# Patient Record
Sex: Female | Born: 1985 | Race: White | Hispanic: No | Marital: Married
Health system: Southern US, Community
[De-identification: ages and names within clinical notes are randomized; demographics above are authoritative.]

---

## 2011-02-28 ENCOUNTER — Other Ambulatory Visit (HOSPITAL_COMMUNITY)
Admission: RE | Admit: 2011-02-28 | Discharge: 2011-02-28 | Disposition: A | Discharge status: Home / Self Care | Payer: BC Managed Care – PPO | Source: Ambulatory Visit | Attending: Obstetrics and Gynecology | Admitting: Obstetrics and Gynecology

## 2011-02-28 DIAGNOSIS — Z124 Encounter for screening for malignant neoplasm of cervix: Secondary | ICD-10-CM | POA: Insufficient documentation

## 2013-06-16 ENCOUNTER — Ambulatory Visit: Payer: BC Managed Care – PPO | Admitting: Internal Medicine

## 2013-06-27 ENCOUNTER — Other Ambulatory Visit: Payer: Self-pay | Admitting: Neurosurgery

## 2013-06-27 DIAGNOSIS — M542 Cervicalgia: Secondary | ICD-10-CM

## 2013-06-29 ENCOUNTER — Ambulatory Visit
Admission: RE | Admit: 2013-06-29 | Discharge: 2013-06-29 | Disposition: A | Discharge status: Home / Self Care | Payer: BC Managed Care – PPO | Source: Ambulatory Visit | Attending: Neurosurgery | Admitting: Neurosurgery

## 2013-06-29 DIAGNOSIS — M542 Cervicalgia: Secondary | ICD-10-CM
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
## 2021-04-17 NOTE — Telephone Encounter (Signed)
Went to urgent care yesterday , they said she has an abnormal ekg , she says she doesn't have a pcp , she says her ins, doesn't require on, very concerned has a 35 year old and her ins. Expires at the end of the year, can you see her ?

## 2021-04-17 NOTE — Telephone Encounter (Signed)
Can see next week

## 2021-04-22 ENCOUNTER — Emergency Department
Admit: 2021-04-22 | Payer: PRIVATE HEALTH INSURANCE | Primary: Student in an Organized Health Care Education/Training Program

## 2021-04-22 ENCOUNTER — Inpatient Hospital Stay
Admit: 2021-04-22 | Discharge: 2021-04-22 | Disposition: A | Discharge status: Home / Self Care | Payer: PRIVATE HEALTH INSURANCE | Attending: Emergency Medicine

## 2021-04-22 DIAGNOSIS — R079 Chest pain, unspecified: Secondary | ICD-10-CM

## 2021-04-22 LAB — METABOLIC PANEL, BASIC
Anion gap: 6 mmol/L (ref 4–12)
BUN: 14 mg/dL (ref 7–18)
CO2: 28 mmol/L (ref 21.0–32.0)
Calcium: 8.5 mg/dL (ref 8.5–10.1)
Chloride: 105 mmol/L (ref 98–107)
Creatinine: 0.74 mg/dL (ref 0.55–1.02)
GFR est AA: >60 mL/min/{1.73_m2} (ref >60)
GFR est non-AA: >60 mL/min/{1.73_m2} (ref >60)
Glucose: 101 mg/dL (ref 74–106)
Potassium: 3.7 mmol/L (ref 3.5–5.1)
Sodium: 139 mmol/L (ref 136–145)

## 2021-04-22 LAB — TROPONIN-HIGH SENSITIVITY: Troponin-High Sensitivity: 3 ng/L (ref 0.0–50.0)

## 2021-04-22 LAB — CBC WITH AUTOMATED DIFF
ABS. BASOPHILS: 0.1 10*3/uL (ref 0.0–0.4)
ABS. EOSINOPHILS: 0 10*3/uL (ref 0.0–1.0)
ABS. IMM. GRANS.: 0 10*3/uL
ABS. LYMPHOCYTES: 2.3 10*3/uL (ref 0.9–4.2)
ABS. MONOCYTES: 0.4 10*3/uL (ref 0.1–1.7)
ABS. NEUTROPHILS: 4.2 10*3/uL (ref 2.3–7.6)
ABSOLUTE NRBC: 0 10*3/uL (ref 0.0–0.01)
ATYPICAL LYMPHS: 2 % — ABNORMAL HIGH
BASOPHILS: 1 % (ref 0.0–3.0)
EOSINOPHILS: 0 % (ref 0.0–7.0)
HCT: 38.4 % (ref 36.0–47.0)
HGB: 12.7 g/dL (ref 12.0–16.0)
IMMATURE GRANULOCYTES: 0 %
LYMPHOCYTES: 31 % (ref 18.0–40.0)
MCH: 29.2 PG (ref 27.0–35.0)
MCHC: 33.1 g/dL (ref 30.7–37.3)
MCV: 88.3 FL (ref 81.0–94.0)
MONOCYTES: 6 % (ref 2.0–12.0)
MPV: 10.1 FL (ref 9.2–11.8)
NEUTROPHILS: 60 % (ref 48.0–72.0)
NRBC: 0 PER 100 WBC
PLATELET ESTIMATE: ADEQUATE
PLATELET: 236 10*3/uL (ref 130–400)
RBC: 4.35 M/uL (ref 4.20–5.40)
RDW: 12.2 % (ref 11.5–14.0)
WBC: 7 10*3/uL (ref 4.8–10.6)

## 2021-04-22 LAB — EKG, 12 LEAD, INITIAL
Atrial Rate: 69 {beats}/min
Calculated P Axis: 57 degrees
Calculated R Axis: 63 degrees
Calculated T Axis: 63 degrees
Diagnosis: NORMAL
P-R Interval: 152 ms
Q-T Interval: 418 ms
QRS Duration: 90 ms
QTC Calculation (Bezet): 447 ms
Ventricular Rate: 69 {beats}/min

## 2021-04-22 LAB — HCG URINE, QL
HCG urine, QL: NEGATIVE
Pregnancy Test(Urn): NEGATIVE

## 2021-04-22 LAB — EKG 12-LEAD
Atrial Rate: 69 {beats}/min
Diagnosis: NORMAL
P Axis: 57 degrees
P-R Interval: 152 ms
Q-T Interval: 418 ms
QRS Duration: 90 ms
QTc Calculation (Bazett): 447 ms
R Axis: 63 degrees
T Axis: 63 degrees
Ventricular Rate: 69 {beats}/min

## 2021-04-22 LAB — CBC WITH AUTO DIFFERENTIAL
Atypical Lymphocytes: 2 % — ABNORMAL HIGH
Basophils %: 1 % (ref 0.0–3.0)
Basophils Absolute: 0.1 10*3/uL (ref 0.0–0.4)
Eosinophils %: 0 % (ref 0.0–7.0)
Eosinophils Absolute: 0 10*3/uL (ref 0.0–1.0)
Granulocyte Absolute Count: 0 10*3/uL
Hematocrit: 38.4 % (ref 36.0–47.0)
Hemoglobin: 12.7 g/dL (ref 12.0–16.0)
Immature Granulocytes: 0 %
Lymphocytes %: 31 % (ref 18.0–40.0)
Lymphocytes Absolute: 2.3 10*3/uL (ref 0.9–4.2)
MCH: 29.2 PG (ref 27.0–35.0)
MCHC: 33.1 g/dL (ref 30.7–37.3)
MCV: 88.3 FL (ref 81.0–94.0)
MPV: 10.1 FL (ref 9.2–11.8)
Monocytes %: 6 % (ref 2.0–12.0)
Monocytes Absolute: 0.4 10*3/uL (ref 0.1–1.7)
NRBC Absolute: 0 10*3/uL (ref 0.0–0.01)
Neutrophils %: 60 % (ref 48.0–72.0)
Neutrophils Absolute: 4.2 10*3/uL (ref 2.3–7.6)
Nucleated RBCs: 0 PER 100 WBC
Platelet Estimate: ADEQUATE
Platelets: 236 10*3/uL (ref 130–400)
RBC: 4.35 M/uL (ref 4.20–5.40)
RDW: 12.2 % (ref 11.5–14.0)
WBC: 7 10*3/uL (ref 4.8–10.6)

## 2021-04-22 LAB — BASIC METABOLIC PANEL
Anion Gap: 6 mmol/L (ref 4–12)
BUN: 14 mg/dL (ref 7–18)
CO2: 28 mmol/L (ref 21.0–32.0)
Calcium: 8.5 mg/dL (ref 8.5–10.1)
Chloride: 105 mmol/L (ref 98–107)
Creatinine: 0.74 mg/dL (ref 0.55–1.02)
EGFR IF NonAfrican American: >60 mL/min/{1.73_m2} (ref >60)
GFR African American: >60 mL/min/{1.73_m2} (ref >60)
Glucose: 101 mg/dL (ref 74–106)
Potassium: 3.7 mmol/L (ref 3.5–5.1)
Sodium: 139 mmol/L (ref 136–145)

## 2021-04-22 LAB — TROPONIN, HIGH SENSITIVITY: Troponin, High Sensitivity: 3 ng/L (ref 0.0–50.0)

## 2021-04-22 MED ORDER — IBUPROFEN 600 MG TAB
600 mg | ORAL_TABLET | Freq: Four times a day (QID) | ORAL | 0 refills | Status: AC | PRN
Start: 2021-04-22 — End: ?

## 2021-04-22 NOTE — ED Notes (Signed)
 Physical assessment completed. The patient's level of consciousness is alert. The patient's mood is cooperative and anxious. The patient's appearance shows normal skin assessment. The patient does not  indicate signs or symptoms of abuse or neglect.

## 2021-04-22 NOTE — ED Notes (Signed)
Patient discharged home by Dr. Faylene Million without incidence. Discharge instructions given to patient written and verbally. Patient verbalizes understanding of discharge instructions.

## 2021-04-22 NOTE — ED Notes (Signed)
Patient states chest pain started over a week ago. States she went to [facility] and they told her she has GERD. States she has a cardiologist appointment in the coming week but she got anxious tonight and started having numbness in her left arm as well.

## 2021-04-22 NOTE — ED Provider Notes (Signed)
35 year old female no contributory past medical history presents with 3 days constant sharp pain in lower left sternum and underneath left breast without aggravating or alleviating factors, prior episodes, or associated symptoms.  Patient made appointment to follow-up with cardiologist about this pain but was concerned that she is to see someone sooner so presented to the ED.  Pain has been constant and unchanged since onset.  Patient denies exertional chest pain, pleuritic chest pain, worsening symptoms, difficulty breathing, abdominal pain, hypertension, hyperlipidemia, diabetes, smoking, drug use, cardiac history, syncope/exertional syncope, early cardiac history in family, unexplained sudden death history in family, marfanoid family, cough/hemoptysis, fever, recent travel/trauma/surgery/immobilization, DVT/PE/hypercoagulability history in self or family, lower extremity edema/calf pain, exogenous estrogen therapy, cancer history.       History reviewed. No pertinent past medical history.    History reviewed. No pertinent surgical history.      History reviewed. No pertinent family history.    Social History     Socioeconomic History    Marital status: SINGLE     Spouse name: Not on file    Number of children: Not on file    Years of education: Not on file    Highest education level: Not on file   Occupational History    Not on file   Tobacco Use    Smoking status: Never    Smokeless tobacco: Never   Substance and Sexual Activity    Alcohol use: Yes     Comment: Socially    Drug use: Never    Sexual activity: Not on file   Other Topics Concern    Not on file   Social History Narrative    Not on file     Social Determinants of Health     Financial Resource Strain: Not on file   Food Insecurity: Not on file   Transportation Needs: Not on file   Physical Activity: Not on file   Stress: Not on file   Social Connections: Not on file   Intimate Partner Violence: Not At Risk    Fear of Current or Ex-Partner: No     Emotionally Abused: No    Physically Abused: No    Sexually Abused: No   Housing Stability: Not on file         ALLERGIES: Patient has no known allergies.    Review of Systems   Constitutional:  Negative for chills and fever.   HENT:  Negative for congestion and rhinorrhea.    Eyes:  Negative for discharge and redness.   Respiratory:  Negative for cough and shortness of breath.    Cardiovascular:  Positive for chest pain. Negative for leg swelling.   Gastrointestinal:  Negative for abdominal pain and vomiting.   Endocrine: Negative for cold intolerance and heat intolerance.   Genitourinary:  Negative for dysuria and frequency.   Musculoskeletal:  Negative for back pain and neck pain.   Neurological:  Negative for seizures and headaches.   Psychiatric/Behavioral:  Negative for self-injury and suicidal ideas.      Vitals:    04/22/21 0028 04/22/21 0201 04/22/21 0350   BP: 108/69 98/67 114/83   Pulse: 72 71 74   Resp: 17 16 16    Temp: 97.3 ??F (36.3 ??C)     SpO2: 100% 99% 99%   Weight: 67.1 kg (148 lb)     Height: 5\' 7"  (1.702 m)              Physical Exam  Vitals and nursing note  reviewed.   Constitutional:       General: She is not in acute distress.     Appearance: Normal appearance. She is not ill-appearing, toxic-appearing or diaphoretic.   HENT:      Head: Normocephalic and atraumatic.      Nose: Nose normal. No congestion or rhinorrhea.      Mouth/Throat:      Mouth: Mucous membranes are moist.      Pharynx: Oropharynx is clear. No posterior oropharyngeal erythema.   Eyes:      General:         Right eye: No discharge.         Left eye: No discharge.      Conjunctiva/sclera: Conjunctivae normal.   Cardiovascular:      Rate and Rhythm: Normal rate and regular rhythm.      Heart sounds: Normal heart sounds. Heart sounds not distant. No murmur heard.    No friction rub. No gallop.   Pulmonary:      Effort: Pulmonary effort is normal. No respiratory distress.      Breath sounds: Normal breath sounds. No stridor. No  decreased breath sounds, wheezing, rhonchi or rales.   Chest:      Chest wall: No mass, deformity, tenderness, crepitus or edema.   Abdominal:      General: There is no distension.      Palpations: Abdomen is soft. There is no mass.      Tenderness: There is no abdominal tenderness. There is no guarding or rebound.   Musculoskeletal:         General: No signs of injury. Normal range of motion.      Cervical back: Normal range of motion and neck supple.      Right lower leg: No edema.      Left lower leg: No edema.   Skin:     General: Skin is warm and dry.      Findings: No rash.   Neurological:      Mental Status: She is alert and oriented to person, place, and time. Mental status is at baseline.      Motor: No weakness.      Coordination: Coordination normal.   Psychiatric:         Mood and Affect: Mood normal.         Behavior: Behavior normal.         Thought Content: Thought content normal.         Judgment: Judgment normal.        MDM  Number of Diagnoses or Management Options  Chest pain, unspecified type  Diagnosis management comments: 3 days constant chest pain, patient well-appearing, normal vital signs, no concerning physical exam findings, PE ruled out with PERC, no risk factors for aortic dissection, obtained chest x-ray EKG and high-sensitivity troponin to rule out pneumothorax, mass, atypical ACS, pericarditis/myocarditis, which showed no emergent findings.  Heart score 1, single troponin appropriate in this patient given 3 days of constant symptoms prior to arrival. patient appropriate for close outpatient follow-up with PCP and cardiologist at this time.  Discussed follow-up instructions and return precautions with patient who showed understanding of them, denied having any other questions or concerns.       Amount and/or Complexity of Data Reviewed  Clinical lab tests: ordered and reviewed  Tests in the radiology section of CPT??: ordered and reviewed           EKG    Date/Time: 04/22/2021 12:31  AM  Performed by: Jeb Levering, MD  Authorized by: Jeb Levering, MD     ECG reviewed by ED Physician in the absence of a cardiologist: no    Previous ECG:     Previous ECG:  Unavailable  Interpretation:     Interpretation: normal    Rate:     ECG rate assessment: normal    Rhythm:     Rhythm: sinus rhythm    Ectopy:     Ectopy: none    QRS:     QRS axis:  Normal    QRS intervals:  Normal    QRS conduction: normal    ST segments:     ST segments:  Normal  T waves:     T waves: normal    Q waves:     Abnormal Q-waves: not present

## 2021-04-23 ENCOUNTER — Ambulatory Visit
Admit: 2021-04-23 | Discharge: 2021-04-23 | Payer: PRIVATE HEALTH INSURANCE | Attending: Cardiovascular Disease | Primary: Student in an Organized Health Care Education/Training Program

## 2021-04-23 DIAGNOSIS — R079 Chest pain, unspecified: Secondary | ICD-10-CM

## 2021-04-23 MED ORDER — NAPROXEN 500 MG TAB
500 mg | ORAL_TABLET | Freq: Two times a day (BID) | ORAL | 0 refills | Status: AC
Start: 2021-04-23 — End: ?

## 2021-04-23 NOTE — Progress Notes (Signed)
Paula Perry is a 35 y.o. female  who comes in for cardiac consultation because patient has experienced 10 days of steady central chest discomfort sometimes radiating above the left breast no other associated symptoms such as shortness of breath sweating fevers feeling productive cough difficulty swallowing change in voice.  She has had not experienced symptoms like this before she was seen in urgency center where she had EKG which showed a sinus rhythm with early repull and first-degree AV block.   She again went to our hospital because symptoms were persistent had lab work negative for infarct normal SMA-7 CBC not pregnant chest x-ray PA lateral unremarkable and EKG sinus rhythm normal  She has never had a cardiac issue as far as palpitations rapid heart rate heart murmur shortness of breath enlarged heart grew up without difficulties she has had no respiratory illness of late she had a flu shot though this was after the symptoms started at the urgency center they gave her some Pepcid to try it did seem to help she has no nocturnal reflux symptoms.  Is a dull feeling is not made worse by changing position breathing swallowing coughing.  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      History reviewed. No pertinent past medical history.      Past Surgical History:   Procedure Laterality Date    HX LEEP PROCEDURE      HX WISDOM TEETH EXTRACTION         No Known Allergies    Current Outpatient Medications   Medication Sig Dispense Refill    naproxen (NAPROSYN) 500 mg tablet Take 1 Tablet by mouth two (2) times daily (with meals). 20 Tablet 0    ibuprofen (MOTRIN) 600 mg tablet Take 1 Tablet by mouth every six  (6) hours as needed for Pain. (Patient not taking: Reported on 04/23/2021) 20 Tablet 0       Family History   Problem Relation Age of Onset    Arthritis-rheumatoid Mother     Diabetes Father     High Cholesterol Father     Hypertension Father     No Known Problems Sister     Sudden Death Brother         OD    Diabetes Maternal Grandfather     Heart Attack Paternal Grandmother     Heart Attack Paternal Grandfather     No Known Problems Son        Social History     Socioeconomic History    Marital status: SINGLE     Spouse name: Not on file    Number of children: Not on file    Years of education: Not on file    Highest education level: Not on file   Occupational History    Not on file   Tobacco Use    Smoking status: Never    Smokeless tobacco: Never   Substance and Sexual Activity    Alcohol use: Yes     Comment: Socially    Drug use: Never    Sexual activity: Not on file   Other  Topics Concern    Not on file   Social History Narrative    Not on file     Social Determinants of Health     Financial Resource Strain: Not on file   Food Insecurity: Not on file   Transportation Needs: Not on file   Physical Activity: Not on file   Stress: Not on file   Social Connections: Not on file   Intimate Partner Violence: Not At Risk    Fear of Current or Ex-Partner: No    Emotionally Abused: No    Physically Abused: No    Sexually Abused: No   Housing Stability: Not on file         Review of systems positive for: As above remainder of the complete review of systems is negative.    Physical exam:  The patient is oriented x 3, appears stated age, well-nourished, well-hydrated.  Visit Vitals  Pulse 72   Resp 16   Wt 150 lb 3.2 oz (68.1 kg)   SpO2 100%   BMI 23.52 kg/m??     HEENT: Normocephalic, conjunctiva pink, sclera white, oral cavity benign  Neck: Supple, no JVD, carotids full, no bruits trachea midline, no adenopathy  Lungs: Normal auscultation and percussion  Cardiac: Regular no murmurs or gallops no rubs  Mild chest wall  tenderness over the lower sternum to palpation slight above the left breast  GI: Bowel sounds present, no tenderness, no masses, no abnormal pulsations  Extremities: Normal pulses, no clubbing, no leg edema, no cords, no inflammation.  Neuro: No focal abnormalities  Genitalia urinary: Deferred     Results for orders placed or performed during the hospital encounter of 04/22/21   HCG URINE, QL   Result Value Ref Range    HCG urine, QL Negative NEG     CBC WITH AUTOMATED DIFF   Result Value Ref Range    WBC 7.0 4.8 - 10.6 K/uL    RBC 4.35 4.20 - 5.40 M/uL    HGB 12.7 12.0 - 16.0 g/dL    HCT 89.3 73.4 - 28.7 %    MCV 88.3 81.0 - 94.0 FL    MCH 29.2 27.0 - 35.0 PG    MCHC 33.1 30.7 - 37.3 g/dL    RDW 68.1 15.7 - 26.2 %    PLATELET 236 130 - 400 K/uL    MPV 10.1 9.2 - 11.8 FL    NRBC 0.0 0 PER 100 WBC    ABSOLUTE NRBC 0.00 0.0 - 0.01 K/uL    NEUTROPHILS 60 48.0 - 72.0 %    LYMPHOCYTES 31 18.0 - 40.0 %    ATYPICAL LYMPHS 2 (H) 0 %    MONOCYTES 6 2.0 - 12.0 %    EOSINOPHILS 0 0.0 - 7.0 %    BASOPHILS 1 0.0 - 3.0 %    IMMATURE GRANULOCYTES 0 %    ABS. NEUTROPHILS 4.2 2.3 - 7.6 K/UL    ABS. LYMPHOCYTES 2.3 0.9 - 4.2 K/UL    ABS. MONOCYTES 0.4 0.1 - 1.7 K/UL    ABS. EOSINOPHILS 0.0 0.0 - 1.0 K/UL    ABS. BASOPHILS 0.1 0.0 - 0.4 K/UL    ABS. IMM. GRANS. 0.0 K/UL    RBC COMMENTS NORMOCYTIC, NORMOCHROMIC      PLATELET ESTIMATE ADEQUATE      DF MANUAL     METABOLIC PANEL, BASIC   Result Value Ref Range    Sodium 139 136 - 145 mmol/L    Potassium 3.7  3.5 - 5.1 mmol/L    Chloride 105 98 - 107 mmol/L    CO2 28 21.0 - 32.0 mmol/L    Anion gap 6 4 - 12 mmol/L    Glucose 101 74 - 106 mg/dL    BUN 14 7 - 18 mg/dL    Creatinine 6.28 3.15 - 1.02 mg/dL    GFR est AA >17 >61 YW/VPX/1.06Y6    GFR est non-AA >60 >60 ml/min/1.40m2    Calcium 8.5 8.5 - 10.1 mg/dL   TROPONIN-HIGH SENSITIVITY   Result Value Ref Range    Troponin-High Sensitivity 3 0.0 - 50.0 ng/L   EKG, 12 LEAD, INITIAL   Result Value Ref Range    Ventricular Rate 69 BPM     Atrial Rate 69 BPM    P-R Interval 152 ms    QRS Duration 90 ms    Q-T Interval 418 ms    QTC Calculation (Bezet) 447 ms    Calculated P Axis 57 degrees    Calculated R Axis 63 degrees    Calculated T Axis 63 degrees    Diagnosis       Normal sinus rhythm  Normal ECG  No previous ECGs available  Confirmed by Janeal Holmes, MD, Zollie Beckers [phone]) on 04/22/2021 7:28:56 AM       No results found for any visits on 04/23/21.     ASSESSMENT:     Encounter Diagnoses   Name Primary?    Chest pain, unspecified type Yes           PLAN:     Orders Placed This Encounter    naproxen (NAPROSYN) 500 mg tablet     Sig: Take 1 Tablet by mouth two (2) times daily (with meals).     Dispense:  20 Tablet     Refill:  0           We will give her Naprosyn 500 mg with breakfast and supper for 5 days warm moist heat to the area 15 minutes 3 times a day  Follow-up in 1 week  She knows to go to the hospital if her symptoms change in intensity or new symptoms develop with palpitation shortness of breath fever chills lightheadedness dizziness etc.    Electronically signed by  Everlean Patterson, MD              Note: This report was transcribed using voice recognition software and is thus   prone to syntax and contextual spelling errors. Please do not hesitate to call   me if anything needs clarification or a dictated addendum

## 2021-05-01 ENCOUNTER — Ambulatory Visit
Admit: 2021-05-01 | Discharge: 2021-05-01 | Payer: PRIVATE HEALTH INSURANCE | Attending: Cardiovascular Disease | Primary: Student in an Organized Health Care Education/Training Program

## 2021-05-01 DIAGNOSIS — R0782 Intercostal pain: Secondary | ICD-10-CM

## 2021-05-01 NOTE — Progress Notes (Signed)
Subjective:   HPI:  Paula Perry is a 35 y.o. female who presents for   Chief Complaint   Patient presents with    Chest Pain     Gone since taking naproxyn       Feels well with Naprosyn with warm moist heat result the chest discomfort she was experiencing she feels completely well now.    History reviewed. No pertinent past medical history.  Current Outpatient Medications   Medication Sig Dispense Refill    naproxen (NAPROSYN) 500 mg tablet Take 1 Tablet by mouth two (2) times daily (with meals). (Patient not taking: Reported on 05/01/2021) 20 Tablet 0    ibuprofen (MOTRIN) 600 mg tablet Take 1 Tablet by mouth every six (6) hours as needed for Pain. (Patient not taking: No sig reported) 20 Tablet 0     No Known Allergies  Past Surgical History:   Procedure Laterality Date    HX LEEP PROCEDURE      HX WISDOM TEETH EXTRACTION       Social History     Socioeconomic History    Marital status: SINGLE   Tobacco Use    Smoking status: Never    Smokeless tobacco: Never   Substance and Sexual Activity    Alcohol use: Yes     Comment: Socially    Drug use: Never     Family History   Problem Relation Age of Onset    Arthritis-rheumatoid Mother     Diabetes Father     High Cholesterol Father     Hypertension Father     No Known Problems Sister     Sudden Death Brother         OD    Diabetes Maternal Grandfather     Heart Attack Paternal Grandmother     Heart Attack Paternal Grandfather     No Known Problems Son         REVIEW OF SYSTEMS:  Review of systems positive for: As above remainder of the complete review of systems is negative.    Objective:   PHYSICAL EXAM:  The patient is oriented x 3, appears stated age, well-nourished, well-hydrated  Visit Vitals  BP 102/64   Pulse 95   Resp 16   SpO2 100%     HEENT: normocephalic conjunctiva are pink sclera white, oral cavity benign  Nek: Supple, no JVD, carotids full, no bruits trachea midline, no adenopathy   Lungs: Normal auscultation and percussicon  Cardiac: Regular no  murmurs or gallops  GI: Bowel sounds present, no tenderness, no masses, no abnormal pulsations  Extremities: Normal pulses, no clubbing, no leg edema, no cords, no inflammation    Results for orders placed or performed during the hospital encounter of 04/22/21   HCG URINE, QL   Result Value Ref Range    HCG urine, QL Negative NEG     CBC WITH AUTOMATED DIFF   Result Value Ref Range    WBC 7.0 4.8 - 10.6 K/uL    RBC 4.35 4.20 - 5.40 M/uL    HGB 12.7 12.0 - 16.0 g/dL    HCT 63.7 85.8 - 85.0 %    MCV 88.3 81.0 - 94.0 FL    MCH 29.2 27.0 - 35.0 PG    MCHC 33.1 30.7 - 37.3 g/dL    RDW 27.7 41.2 - 87.8 %    PLATELET 236 130 - 400 K/uL    MPV 10.1 9.2 - 11.8 FL    NRBC 0.0 0  PER 100 WBC    ABSOLUTE NRBC 0.00 0.0 - 0.01 K/uL    NEUTROPHILS 60 48.0 - 72.0 %    LYMPHOCYTES 31 18.0 - 40.0 %    ATYPICAL LYMPHS 2 (H) 0 %    MONOCYTES 6 2.0 - 12.0 %    EOSINOPHILS 0 0.0 - 7.0 %    BASOPHILS 1 0.0 - 3.0 %    IMMATURE GRANULOCYTES 0 %    ABS. NEUTROPHILS 4.2 2.3 - 7.6 K/UL    ABS. LYMPHOCYTES 2.3 0.9 - 4.2 K/UL    ABS. MONOCYTES 0.4 0.1 - 1.7 K/UL    ABS. EOSINOPHILS 0.0 0.0 - 1.0 K/UL    ABS. BASOPHILS 0.1 0.0 - 0.4 K/UL    ABS. IMM. GRANS. 0.0 K/UL    RBC COMMENTS NORMOCYTIC, NORMOCHROMIC      PLATELET ESTIMATE ADEQUATE      DF MANUAL     METABOLIC PANEL, BASIC   Result Value Ref Range    Sodium 139 136 - 145 mmol/L    Potassium 3.7 3.5 - 5.1 mmol/L    Chloride 105 98 - 107 mmol/L    CO2 28 21.0 - 32.0 mmol/L    Anion gap 6 4 - 12 mmol/L    Glucose 101 74 - 106 mg/dL    BUN 14 7 - 18 mg/dL    Creatinine 1.88 4.16 - 1.02 mg/dL    GFR est AA >60 >63 KZ/SWF/0.93A3    GFR est non-AA >60 >60 ml/min/1.69m2    Calcium 8.5 8.5 - 10.1 mg/dL   TROPONIN-HIGH SENSITIVITY   Result Value Ref Range    Troponin-High Sensitivity 3 0.0 - 50.0 ng/L   EKG, 12 LEAD, INITIAL   Result Value Ref Range    Ventricular Rate 69 BPM    Atrial Rate 69 BPM    P-R Interval 152 ms    QRS Duration 90 ms    Q-T Interval 418 ms    QTC Calculation (Bezet) 447 ms     Calculated P Axis 57 degrees    Calculated R Axis 63 degrees    Calculated T Axis 63 degrees    Diagnosis       Normal sinus rhythm  Normal ECG  No previous ECGs available  Confirmed by Janeal Holmes, MD, Zollie Beckers [phone]) on 04/22/2021 7:28:56 AM       No results found for any visits on 05/01/21.         Assessment/Plan:         Assessment:      Encounter Diagnoses   Name Primary?    Intercostal pain Yes             Plan:   No orders of the defined types were placed in this encounter.          Continue with present life style  Call if changes         Electronically signed by  Everlean Patterson, MD          Note: This report was transcribed using voice recognition software and is thus   prone to syntax and contextual spelling errors. Please do not hesitate to call   me if anything needs clarification or a dictated addendum
